# Patient Record
Sex: Female | Born: 1970 | Race: White | Hispanic: No | Marital: Married | Smoking: Never smoker
Health system: Southern US, Community
[De-identification: ages and names within clinical notes are randomized; demographics above are authoritative.]

## PROBLEM LIST (undated history)

## (undated) DIAGNOSIS — D219 Benign neoplasm of connective and other soft tissue, unspecified: Secondary | ICD-10-CM

## (undated) DIAGNOSIS — I10 Essential (primary) hypertension: Secondary | ICD-10-CM

## (undated) DIAGNOSIS — Z9289 Personal history of other medical treatment: Secondary | ICD-10-CM

## (undated) DIAGNOSIS — Z9889 Other specified postprocedural states: Secondary | ICD-10-CM

## (undated) DIAGNOSIS — N6001 Solitary cyst of right breast: Secondary | ICD-10-CM

## (undated) HISTORY — DX: Solitary cyst of right breast: N60.01

## (undated) HISTORY — DX: Personal history of other medical treatment: Z92.89

## (undated) HISTORY — DX: Benign neoplasm of connective and other soft tissue, unspecified: D21.9

## (undated) HISTORY — DX: Essential (primary) hypertension: I10

---

## 1999-03-14 HISTORY — PX: APPENDECTOMY: SHX54

## 2004-02-09 ENCOUNTER — Inpatient Hospital Stay: Payer: Self-pay

## 2005-04-07 ENCOUNTER — Other Ambulatory Visit: Payer: Self-pay

## 2005-04-07 ENCOUNTER — Ambulatory Visit: Payer: Self-pay | Admitting: Infectious Diseases

## 2005-12-26 ENCOUNTER — Ambulatory Visit: Payer: Self-pay

## 2006-11-28 ENCOUNTER — Ambulatory Visit: Payer: Self-pay | Admitting: Internal Medicine

## 2006-12-06 ENCOUNTER — Ambulatory Visit: Payer: Self-pay | Admitting: Internal Medicine

## 2007-08-14 ENCOUNTER — Ambulatory Visit: Payer: Self-pay | Admitting: Internal Medicine

## 2010-02-16 ENCOUNTER — Ambulatory Visit: Payer: Self-pay | Admitting: Obstetrics and Gynecology

## 2010-02-22 ENCOUNTER — Ambulatory Visit: Payer: Self-pay | Admitting: Obstetrics and Gynecology

## 2011-02-21 ENCOUNTER — Ambulatory Visit: Payer: Self-pay

## 2012-05-01 ENCOUNTER — Ambulatory Visit: Payer: Self-pay

## 2012-05-01 LAB — HCG, QUANTITATIVE, PREGNANCY: Beta Hcg, Quant.: <1 m[IU]/mL — ABNORMAL LOW

## 2012-05-09 ENCOUNTER — Ambulatory Visit: Payer: Self-pay

## 2012-05-09 HISTORY — PX: LAPAROSCOPIC SUPRACERVICAL HYSTERECTOMY: SUR797

## 2012-05-10 LAB — PATHOLOGY REPORT

## 2012-11-05 ENCOUNTER — Ambulatory Visit: Payer: Self-pay | Admitting: Internal Medicine

## 2014-03-18 ENCOUNTER — Ambulatory Visit: Payer: Self-pay

## 2014-07-03 NOTE — Op Note (Signed)
PATIENT NAME:  Kendra Bates, Kendra Bates MR#:  [id number] DATE OF BIRTH:  [birth year]  DATE OF PROCEDURE:  05/09/2012  PREOPERATIVE DIAGNOSES:  Leiomyoma and menorrhagia.   POSTOPERATIVE DIAGNOSES: Leiomyoma and menorrhagia.   PROCEDURE: Laparoscopic supracervical hysterectomy.   SURGEON: Wonda Cheng. Laurey Morale, MD  FIRST ASSISTANT: Prentice Docker, MD   OPERATIVE FINDINGS: A 14-week size leiomyomatous uterus. Tubes and ovaries appeared normal, therefore left in situ. The size of the uterus made exposure difficult. Leiomyoma was also calcified making morcellation difficult.   DESCRIPTION OF PROCEDURE: After adequate general anesthesia, the patient was prepped and draped in routine fashion. An infraumbilical incision was made through the skin and approximately 3 liters of carbon dioxide were insufflated without incident. During insufflation  uterine manipulator was placed and the bladder was drained. The laparoscope was then inserted. The above findings were noted. Accessory ports were placed in right and left lower quadrants. The left cornu was grasped and elevated. The round ligament, tube, suspensory ligament and ovary were serially divided with both Harmonic and bipolar forceps. A like procedure was then carried as out on the other side. The exposure of the uterine vessels was then attempted and the uterine vessels were cauterized with bipolar forceps bilaterally. A bladder flap had been created and the bladder was pushed down. The specimen was then amputated. Several additional areas of bleeding were cauterized. The endocervical canal was cauterized. The morcellator was inserted and, after much time, ultimately the uterus was totally morcellated. Morcellation was exceedingly difficult as part of the fibroids were calcified. Ultimately, the specimen was removed. The pelvis was lavaged with copious amounts of saline. All areas of surgery were inspected and found to be hemostatic. CO2 was allowed to escape and the  incisions closed in     routine fashion. The patient tolerated the procedure well and left the operating room in good condition. Sponge and needle counts were said to be correct at the end of the procedure. Estimated blood loss 100 mL.  ____________________________ Wonda Cheng. Laurey Morale, MD pjr:sb D: 05/09/2012 12:50:52 ET T: 05/09/2012 15:03:29 ET JOB#: [id number]  cc: Wonda Cheng. Laurey Morale, MD, <Dictator> Rosina Lowenstein MD ELECTRONICALLY SIGNED 05/16/2012 7:29

## 2015-06-11 DIAGNOSIS — Z9289 Personal history of other medical treatment: Secondary | ICD-10-CM

## 2015-06-11 HISTORY — DX: Personal history of other medical treatment: Z92.89

## 2015-11-09 ENCOUNTER — Other Ambulatory Visit: Payer: Self-pay | Admitting: Internal Medicine

## 2015-11-09 DIAGNOSIS — M542 Cervicalgia: Secondary | ICD-10-CM

## 2016-11-27 ENCOUNTER — Ambulatory Visit (INDEPENDENT_AMBULATORY_CARE_PROVIDER_SITE_OTHER): Payer: 59 | Admitting: Certified Nurse Midwife

## 2016-11-27 ENCOUNTER — Encounter: Payer: Self-pay | Admitting: Certified Nurse Midwife

## 2016-11-27 VITALS — BP 120/72 | HR 74 | Ht 64.5 in | Wt 144.0 lb

## 2016-11-27 DIAGNOSIS — Z1239 Encounter for other screening for malignant neoplasm of breast: Secondary | ICD-10-CM

## 2016-11-27 DIAGNOSIS — I10 Essential (primary) hypertension: Secondary | ICD-10-CM | POA: Insufficient documentation

## 2016-11-27 DIAGNOSIS — D219 Benign neoplasm of connective and other soft tissue, unspecified: Secondary | ICD-10-CM | POA: Insufficient documentation

## 2016-11-27 DIAGNOSIS — Z1231 Encounter for screening mammogram for malignant neoplasm of breast: Secondary | ICD-10-CM | POA: Diagnosis not present

## 2016-11-27 DIAGNOSIS — Z1211 Encounter for screening for malignant neoplasm of colon: Secondary | ICD-10-CM | POA: Diagnosis not present

## 2016-11-27 DIAGNOSIS — Z124 Encounter for screening for malignant neoplasm of cervix: Secondary | ICD-10-CM

## 2016-11-27 DIAGNOSIS — Z01419 Encounter for gynecological examination (general) (routine) without abnormal findings: Secondary | ICD-10-CM | POA: Diagnosis not present

## 2016-11-27 LAB — HEMOCCULT GUIAC POC 1CARD (OFFICE): FECAL OCCULT BLD: NEGATIVE

## 2016-11-27 NOTE — Progress Notes (Signed)
Gynecology Annual Exam  PCP: Idelle Crouch, MD  Chief Complaint:  Chief Complaint  Patient presents with  . Gynecologic Exam    History of Present Illness:Kendra Bates is a 46 year old Caucasian/White female , G 1 P 1 0 0 1 , who presents for her annual exam . She is having no significant GYN problems. Will occasionally have some irritation at her vaginal opening.  Her menses are absent s/p [facility] for fibroids She does not have vasomotor symptoms.  She has had no spotting.   The patient's past medical history is notable for a history of hypertension and anxiety/stress..  Since her last annual GYN exam dated 06/11/2015, she has had no significant changes in her health history.  She is sexually active.  Her most recent pap smear was obtained 06/11/2015 and was with negative cells and negative HPV DNA. Remote history of abnormal 2005/2006. Had colposcopy. Paps normal since then. Her most recent mammogram obtained on 06/11/2015 was read as negative with stable calcifications There is a positive history of breast cancer in her mother. Genetic testing has not been done.  There is no family history of ovarian cancer.  The patient does do occ monthly self breast exams.  The patient does not smoke.  The patient does drink alcohol occasionally  The patient does not use illegal drugs.  The patient exercises occasionally. Has just joined a gym. The patient does get adequate calcium in her diet.  She had a recent cholesterol screen in 2018 ( by her PCP Dr Doy Hutching) that was normal.  .  The patient denies current symptoms of depression.    Review of Systems: Review of Systems  Constitutional: Negative for chills, fever and weight loss.  HENT: Negative for congestion, sinus pain and sore throat.   Eyes: Negative for blurred vision and pain.  Respiratory: Negative for hemoptysis, shortness of breath and wheezing.   Cardiovascular: Negative for chest pain, palpitations and leg swelling.    Gastrointestinal: Negative for abdominal pain, blood in stool, diarrhea, heartburn, nausea and vomiting.  Genitourinary: Negative for dysuria, frequency, hematuria and urgency.  Musculoskeletal: Negative for back pain, joint pain and myalgias.  Skin: Negative for itching and rash.  Neurological: Negative for dizziness, tingling and headaches.  Endo/Heme/Allergies: Negative for environmental allergies and polydipsia. Does not bruise/bleed easily.       Negative for hirsutism   Psychiatric/Behavioral: Negative for depression. The patient is not nervous/anxious and does not have insomnia.     Past Medical History:  Past Medical History:  Diagnosis Date  . Fibroids    leiomyoma  . History of mammogram 06/11/2015   benign  . Hypertension     Past Surgical History:  Past Surgical History:  Procedure Laterality Date  . APPENDECTOMY  2001  . LAPAROSCOPIC SUPRACERVICAL HYSTERECTOMY  05/09/2012   leiomyoma/mennorrhagia    Family History:  Family History  Problem Relation Age of Onset  . Breast cancer Mother 68  . Hypertension Mother   . Congestive Heart Failure Maternal Grandmother 86  . Breast cancer Cousin        paternal first    Social History:  Social History   Social History  . Marital status: Married    Spouse name: N/A  . Number of children: 1  . Years of education: 47   Occupational History  . Receptionist at [employer]    Social History Main Topics  . Smoking status: Never Smoker  . Smokeless tobacco: Never  Used  . Alcohol use Yes     Comment: occasionally  . Drug use: No  . Sexual activity: Yes    Birth control/ protection: Surgical   Other Topics Concern  . Not on file   Social History Narrative  . No narrative on file    Allergies:  Allergies  Allergen Reactions  . Sporanox [Itraconazole] Rash  . Augmentin [Amoxicillin-Pot Clavulanate] Rash    Medications: Prior to Admission medications   Medication Sig Start Date End Date Taking?  Authorizing Provider  amLODipine (NORVASC) 5 MG tablet Take by mouth. 11/01/16  Yes [provider]  escitalopram (LEXAPRO) 10 MG tablet Take by mouth. 11/01/16  Yes [provider]  losartan (COZAAR) 100 MG tablet Take by mouth. 11/01/16  Yes [provider]  Vitamin D3 1000 IU daily  Physical Exam Vitals: Blood pressure 120/72, pulse 74, height 5' 4.5" (1.638 m), weight 144 lb (65.3 kg). BMI 24.34 kg/m2  General: WF inq NAD HEENT: normocephalic, anicteric Neck: no thyroid enlargement, no palpable nodules; shoddy NT cervical lymphadenopathy in anterior chain  Pulmonary: No increased work of breathing, CTAB Cardiovascular: RRR, without murmur  Breast: Breast symmetrical, no tenderness, no skin retraction present, no nipple discharge. Left nipple chronically "indented". There is a 1 cm sebaccous cyst in right breast at 6 o'clock just below the aerola. No breast masses bilaterally No axillary, infraclavicular or supraclavicular lymphadenopathy. Abdomen: Soft, non-tender, non-distended.  Umbilicus without lesions.  No hepatomegaly or masses palpable. No evidence of hernia. Genitourinary:  External: Normal external female genitalia.  Normal urethral meatus, normal Bartholin's and Skene's glands.    Vagina: Normal vaginal mucosa, no evidence of prolapse.    Cervix: Grossly normal in appearance, no bleeding, non-tender  Uterus: surgically absent  Adnexa: No adnexal masses, non-tender  Rectal: NO MASSES, HEMOCCULT NEGATIVE  Lymphatic: no evidence of inguinal lymphadenopathy Extremities: no edema, erythema, or tenderness Neurologic: Grossly intact Psychiatric: mood appropriate, affect full     Assessment: 46 y.o. G1P1001 normal annual gyn exam  Plan:   1) Breast cancer screening - recommend monthly self breast exam. Mammogram was ordered today. Patient to schedule at [facility]  2) Colon cancer screening options discussed with patient. Desires FIT test in office  annually at this time. FIT was negative.  3) Cervical cancer screening - Pap was done. ASCCP guidelines and rational discussed.  Patient opts for yearly screening interval  4) Contraception -NA due to hystertectomy  5) Routine healthcare maintenance including cholesterol and diabetes screening managed by PCP   Dalia Heading, CNM

## 2016-11-29 LAB — IGP,RFX APTIMA HPV ALL PTH: PAP Smear Comment: 0

## 2016-12-08 ENCOUNTER — Other Ambulatory Visit: Payer: Self-pay | Admitting: *Deleted

## 2016-12-08 ENCOUNTER — Inpatient Hospital Stay
Admission: RE | Admit: 2016-12-08 | Discharge: 2016-12-08 | Disposition: A | Discharge status: Home / Self Care | Payer: Self-pay | Source: Ambulatory Visit | Attending: *Deleted | Admitting: *Deleted

## 2016-12-08 DIAGNOSIS — Z9289 Personal history of other medical treatment: Secondary | ICD-10-CM

## 2016-12-11 DIAGNOSIS — N6001 Solitary cyst of right breast: Secondary | ICD-10-CM

## 2016-12-11 HISTORY — DX: Solitary cyst of right breast: N60.01

## 2016-12-21 ENCOUNTER — Ambulatory Visit
Admission: RE | Admit: 2016-12-21 | Discharge: 2016-12-21 | Disposition: A | Discharge status: Home / Self Care | Payer: 59 | Source: Ambulatory Visit | Attending: Certified Nurse Midwife | Admitting: Certified Nurse Midwife

## 2016-12-21 ENCOUNTER — Other Ambulatory Visit: Payer: Self-pay | Admitting: Certified Nurse Midwife

## 2016-12-21 DIAGNOSIS — R928 Other abnormal and inconclusive findings on diagnostic imaging of breast: Secondary | ICD-10-CM

## 2016-12-21 DIAGNOSIS — Z1231 Encounter for screening mammogram for malignant neoplasm of breast: Secondary | ICD-10-CM | POA: Diagnosis not present

## 2016-12-21 DIAGNOSIS — Z1239 Encounter for other screening for malignant neoplasm of breast: Secondary | ICD-10-CM

## 2016-12-21 DIAGNOSIS — N6310 Unspecified lump in the right breast, unspecified quadrant: Secondary | ICD-10-CM

## 2016-12-29 ENCOUNTER — Ambulatory Visit
Admission: RE | Admit: 2016-12-29 | Discharge: 2016-12-29 | Disposition: A | Discharge status: Home / Self Care | Payer: 59 | Source: Ambulatory Visit | Attending: Certified Nurse Midwife | Admitting: Certified Nurse Midwife

## 2016-12-29 DIAGNOSIS — R928 Other abnormal and inconclusive findings on diagnostic imaging of breast: Secondary | ICD-10-CM

## 2016-12-29 DIAGNOSIS — L723 Sebaceous cyst: Secondary | ICD-10-CM | POA: Insufficient documentation

## 2016-12-29 DIAGNOSIS — N6310 Unspecified lump in the right breast, unspecified quadrant: Secondary | ICD-10-CM

## 2017-07-09 ENCOUNTER — Other Ambulatory Visit: Payer: Self-pay

## 2017-07-09 ENCOUNTER — Encounter: Payer: Self-pay | Admitting: Emergency Medicine

## 2017-07-09 ENCOUNTER — Emergency Department
Admission: EM | Admit: 2017-07-09 | Discharge: 2017-07-09 | Disposition: A | Discharge status: Home / Self Care | Payer: 59 | Attending: Emergency Medicine | Admitting: Emergency Medicine

## 2017-07-09 DIAGNOSIS — R112 Nausea with vomiting, unspecified: Secondary | ICD-10-CM | POA: Diagnosis present

## 2017-07-09 DIAGNOSIS — I10 Essential (primary) hypertension: Secondary | ICD-10-CM | POA: Insufficient documentation

## 2017-07-09 DIAGNOSIS — K529 Noninfective gastroenteritis and colitis, unspecified: Secondary | ICD-10-CM

## 2017-07-09 DIAGNOSIS — Z79899 Other long term (current) drug therapy: Secondary | ICD-10-CM | POA: Diagnosis not present

## 2017-07-09 LAB — COMPREHENSIVE METABOLIC PANEL
ALT: 17 U/L (ref 14–54)
ANION GAP: 11 (ref 5–15)
AST: 23 U/L (ref 15–41)
Albumin: 4.6 g/dL (ref 3.5–5.0)
Alkaline Phosphatase: 91 U/L (ref 38–126)
BUN: 20 mg/dL (ref 6–20)
CHLORIDE: 102 mmol/L (ref 101–111)
CO2: 23 mmol/L (ref 22–32)
CREATININE: 0.65 mg/dL (ref 0.44–1.00)
Calcium: 9.2 mg/dL (ref 8.9–10.3)
Glucose, Bld: 111 mg/dL — ABNORMAL HIGH (ref 65–99)
POTASSIUM: 4.4 mmol/L (ref 3.5–5.1)
SODIUM: 136 mmol/L (ref 135–145)
Total Bilirubin: 1.2 mg/dL (ref 0.3–1.2)
Total Protein: 7.8 g/dL (ref 6.5–8.1)

## 2017-07-09 LAB — CBC
HEMATOCRIT: 43 % (ref 35.0–47.0)
HEMOGLOBIN: 14.8 g/dL (ref 12.0–16.0)
MCH: 32 pg (ref 26.0–34.0)
MCHC: 34.5 g/dL (ref 32.0–36.0)
MCV: 92.8 fL (ref 80.0–100.0)
PLATELETS: 158 10*3/uL (ref 150–440)
RBC: 4.64 MIL/uL (ref 3.80–5.20)
RDW: 12.7 % (ref 11.5–14.5)
WBC: 10.9 10*3/uL (ref 3.6–11.0)

## 2017-07-09 LAB — LIPASE, BLOOD: LIPASE: 27 U/L (ref 11–51)

## 2017-07-09 MED ORDER — DICYCLOMINE HCL 20 MG PO TABS: 20.0000 mg | ORAL_TABLET | Freq: Three times a day (TID) | ORAL | 0 refills | Status: AC | PRN

## 2017-07-09 MED ORDER — ONDANSETRON HCL 4 MG/2ML IJ SOLN
INTRAMUSCULAR | Status: AC
  Filled 2017-07-09: qty 2

## 2017-07-09 MED ORDER — ONDANSETRON HCL 4 MG/2ML IJ SOLN
4.0000 mg | Freq: Once | INTRAMUSCULAR | Status: AC
  Administered 2017-07-09: 4 mg via INTRAVENOUS

## 2017-07-09 MED ORDER — ONDANSETRON HCL 4 MG PO TABS: 4.0000 mg | ORAL_TABLET | Freq: Three times a day (TID) | ORAL | 0 refills | Status: AC | PRN

## 2017-07-09 MED ORDER — SODIUM CHLORIDE 0.9 % IV BOLUS
1000.0000 mL | Freq: Once | INTRAVENOUS | Status: AC
  Administered 2017-07-09: 1000 mL via INTRAVENOUS

## 2017-07-09 NOTE — ED Notes (Signed)
Report received from [facility]. Patient care assumed. Patient/RN introduction complete. Will continue to monitor.

## 2017-07-09 NOTE — ED Notes (Signed)
Patient discharged to home per MD order. Patient in stable condition, and deemed medically cleared by ED provider for discharge. Discharge instructions reviewed with patient/family using "Teach Back"; verbalized understanding of medication education and administration, and information about follow-up care. Denies further concerns. ° °

## 2017-07-09 NOTE — Discharge Instructions (Addendum)
Please seek medical attention for any high fevers, chest pain, shortness of breath, change in behavior, persistent vomiting, bloody stool or any other new or concerning symptoms.  

## 2017-07-09 NOTE — ED Notes (Signed)
Pt states pain improved rates it 3/10 at this time, given ginger ale.

## 2017-07-09 NOTE — ED Provider Notes (Signed)
[facility] Emergency Department Provider Note  ____________________________________________   I have reviewed the triage vital signs and the nursing notes.   HISTORY  Chief Complaint Abdominal Pain   History limited by: Not Limited   HPI Kendra Bates is a 47 y.o. female who presents to the emergency department today because of concerns for nausea vomiting and diarrhea.  The symptoms started suddenly.  This started 1 PM today.  Has been accompanied by some centralized abdominal pain and discomfort.  Patient denies any unusual ingestions.  She might of had a sick contact at work although they worked in a slightly different division.  Patient has not had any fevers.   Per medical record review patient has a history of appendectomy.  Past Medical History:  Diagnosis Date  . Fibroids    leiomyoma  . History of mammogram 06/11/2015   benign  . Hypertension     Patient Active Problem List   Diagnosis Date Noted  . Hypertension   . Fibroids     Past Surgical History:  Procedure Laterality Date  . APPENDECTOMY  2001  . LAPAROSCOPIC SUPRACERVICAL HYSTERECTOMY  05/09/2012   leiomyoma/mennorrhagia    Prior to Admission medications   Medication Sig Start Date End Date Taking? Authorizing Provider  amLODipine (NORVASC) 5 MG tablet Take by mouth. 11/01/16   [provider]  escitalopram (LEXAPRO) 10 MG tablet Take by mouth. 11/01/16   [provider]  losartan (COZAAR) 100 MG tablet Take by mouth. 11/01/16   [provider]  Vitamin D, Cholecalciferol, 1000 units TABS Take 1 tablet by mouth daily.    [provider]    Allergies Sporanox [itraconazole] and Augmentin [amoxicillin-pot clavulanate]  Family History  Problem Relation Age of Onset  . Breast cancer Mother 67  . Hypertension Mother   . Congestive Heart Failure Maternal Grandmother 86  . Breast cancer Cousin        paternal first    Social  History Social History   Tobacco Use  . Smoking status: Never Smoker  . Smokeless tobacco: Never Used  Substance Use Topics  . Alcohol use: Yes    Comment: occasionally  . Drug use: No    Review of Systems Constitutional: No fever/chills Eyes: No visual changes. ENT: No sore throat. Cardiovascular: Denies chest pain. Respiratory: Denies shortness of breath. Gastrointestinal: Positive for centralized abdominal pain. Nausea, vomiting and diarrhea. Genitourinary: Negative for dysuria. Musculoskeletal: Positive for back pain.  Skin: Negative for rash. Neurological: Negative for headaches, focal weakness or numbness.  ____________________________________________   PHYSICAL EXAM:  VITAL SIGNS: ED Triage Vitals  Enc Vitals Group     BP 07/09/17 1645 (!) 120/57     Pulse Rate 07/09/17 1645 96     Resp 07/09/17 1645 18     Temp 07/09/17 1644 99.8 F (37.7 C)     Temp Source 07/09/17 1644 Oral     SpO2 07/09/17 1645 100 %     Weight 07/09/17 1644 140 lb (63.5 kg)     Height 07/09/17 1644 5\' 5"  (1.651 m)     Head Circumference --      Peak Flow --      Pain Score 07/09/17 1644 7   Constitutional: Alert and oriented. Well appearing and in no distress. Eyes: Conjunctivae are normal.  ENT   Head: Normocephalic and atraumatic.   Nose: No congestion/rhinnorhea.   Mouth/Throat: Mucous membranes are moist.   Neck: No stridor. Hematological/Lymphatic/Immunilogical: No cervical lymphadenopathy. Cardiovascular:  Normal rate, regular rhythm.  No murmurs, rubs, or gallops. Respiratory: Normal respiratory effort without tachypnea nor retractions. Breath sounds are clear and equal bilaterally. No wheezes/rales/rhonchi. Gastrointestinal: Soft and minimally tender diffusely. No rebound. No guarding.  Genitourinary: Deferred Musculoskeletal: Normal range of motion in all extremities. No lower extremity edema. Neurologic:  Normal speech and language. No gross focal neurologic  deficits are appreciated.  Skin:  Skin is warm, dry and intact. No rash noted. Psychiatric: Mood and affect are normal. Speech and behavior are normal. Patient exhibits appropriate insight and judgment.  ____________________________________________    LABS (pertinent positives/negatives)  Lipase 27 CMP wnl except glu 111 CBC wbc 10.9, hgb 14.8, plt 158  ____________________________________________   EKG  None  ____________________________________________    RADIOLOGY  None  ____________________________________________   PROCEDURES  Procedures  ____________________________________________   INITIAL IMPRESSION / ASSESSMENT AND PLAN / ED COURSE  Pertinent labs & imaging results that were available during my care of the patient were reviewed by me and considered in my medical decision making (see chart for details).  Patient presented to the emergency department today because of concerns for nausea vomiting diarrhea and some abdominal discomfort.  Differential would be broad including hepatitis pancreatitis gastritis gastroenteritis appendicitis diverticulitis amongst other etiologies.  Patient's blood work without any concerning findings.  She did feel better after fluids and antiemetics.  At this point I think gastroenteritis likely especially given sick contact possibility.  Did discuss this with the patient.  Discussed return precautions.   ____________________________________________   FINAL CLINICAL IMPRESSION(S) / ED DIAGNOSES  Final diagnoses:  Gastroenteritis     Note: This dictation was prepared with Dragon dictation. Any transcriptional errors that result from this process are unintentional     Nance Pear, MD 07/09/17 2028

## 2017-07-09 NOTE — ED Triage Notes (Signed)
Here for NVD since 1 pm today.  Unable to keep anything down per pt.  Some abdominal soreness that started after the vomiting/dry heaves.  VSS.  Color WNL. NAD

## 2017-07-09 NOTE — ED Notes (Signed)
Pt tolerating po fluids well  

## 2017-07-25 ENCOUNTER — Encounter: Payer: Self-pay | Admitting: Obstetrics and Gynecology

## 2017-07-25 ENCOUNTER — Ambulatory Visit (INDEPENDENT_AMBULATORY_CARE_PROVIDER_SITE_OTHER): Payer: 59 | Admitting: Obstetrics and Gynecology

## 2017-07-25 VITALS — BP 110/74 | HR 71 | Ht 65.0 in | Wt 144.0 lb

## 2017-07-25 DIAGNOSIS — L089 Local infection of the skin and subcutaneous tissue, unspecified: Secondary | ICD-10-CM

## 2017-07-25 DIAGNOSIS — L723 Sebaceous cyst: Secondary | ICD-10-CM

## 2017-07-25 MED ORDER — DOXYCYCLINE HYCLATE 100 MG PO CAPS: 100.0000 mg | ORAL_CAPSULE | Freq: Two times a day (BID) | ORAL | 0 refills | Status: AC

## 2017-07-25 NOTE — Progress Notes (Signed)
Kendra Crouch, MD   Chief Complaint  Patient presents with  . Breast Problem    Cyst in right breast is sore, red, and swollen x4    HPI:      Kendra Bates is a 47 y.o. G1P1001 who LMP was No LMP recorded. Patient has had a hysterectomy., presents today for infected RT breast cyst for the past 4 days. Sx started randomly. No d/c, fevers. Did do warm compress last night and neosporin today. Pt was noted to have "1.2 cm benign palpable sebaceous cyst associated with the skin of the 6 o'clock right breast 1 cm from the nipple" on 10/18 mammo. Pt hasn't had any problems until now.  No other breast mass/complaints. FH of breast cancer in her mom and pat cousin, doesn't qualify for cancer genetic testing.   Past Medical History:  Diagnosis Date  . Benign cyst of right breast in female 12/2016  . Fibroids    leiomyoma  . History of mammogram 06/11/2015   benign  . Hypertension     Past Surgical History:  Procedure Laterality Date  . APPENDECTOMY  2001  . LAPAROSCOPIC SUPRACERVICAL HYSTERECTOMY  05/09/2012   leiomyoma/mennorrhagia    Family History  Problem Relation Age of Onset  . Breast cancer Mother 31  . Hypertension Mother   . Congestive Heart Failure Maternal Grandmother 86  . Breast cancer Cousin        paternal first    Social History   Socioeconomic History  . Marital status: Married    Spouse name: Not on file  . Number of children: 1  . Years of education: 55  . Highest education level: Not on file  Occupational History  . Occupation: Receptionist at [employer]  . Financial resource strain: Not on file  . Food insecurity:    Worry: Not on file    Inability: Not on file  . Transportation needs:    Medical: Not on file    Non-medical: Not on file  Tobacco Use  . Smoking status: Never Smoker  . Smokeless tobacco: Never Used  Substance and Sexual Activity  . Alcohol use: Yes    Comment: occasionally  . Drug use: No  .  Sexual activity: Yes    Birth control/protection: Surgical  Lifestyle  . Physical activity:    Days per week: Not on file    Minutes per session: Not on file  . Stress: Not on file  Relationships  . Social connections:    Talks on phone: Not on file    Gets together: Not on file    Attends religious service: Not on file    Active member of club or organization: Not on file    Attends meetings of clubs or organizations: Not on file    Relationship status: Not on file  . Intimate partner violence:    Fear of current or ex partner: Not on file    Emotionally abused: Not on file    Physically abused: Not on file    Forced sexual activity: Not on file  Other Topics Concern  . Not on file  Social History Narrative  . Not on file    Outpatient Medications Prior to Visit  Medication Sig Dispense Refill  . amLODipine (NORVASC) 5 MG tablet Take by mouth.    . escitalopram (LEXAPRO) 10 MG tablet Take by mouth.    . losartan (COZAAR) 100 MG tablet Take by mouth.    Marland Kitchen  Vitamin D, Cholecalciferol, 1000 units TABS Take 1 tablet by mouth daily.    Marland Kitchen dicyclomine (BENTYL) 20 MG tablet Take 1 tablet (20 mg total) by mouth 3 (three) times daily as needed (abdominal pain). (Patient not taking: Reported on 07/25/2017) 30 tablet 0  . ondansetron (ZOFRAN) 4 MG tablet Take 1 tablet (4 mg total) by mouth every 8 (eight) hours as needed for nausea or vomiting. (Patient not taking: Reported on 07/25/2017) 20 tablet 0   No facility-administered medications prior to visit.       ROS:  Review of Systems  Constitutional: Negative for fever.  Genitourinary: Negative for dyspareunia, dysuria, flank pain, frequency, hematuria, urgency, vaginal bleeding, vaginal discharge and vaginal pain.  Musculoskeletal: Negative for back pain.  Skin: Positive for rash and wound.  BREAST: pain, infection, redness   OBJECTIVE:   Vitals:  BP 110/74   Pulse 71   Ht 5\' 5"  (1.651 m)   Wt 144 lb (65.3 kg)   BMI 23.96  kg/m   Physical Exam  Constitutional: She is oriented to person, place, and time. She appears well-developed.  Neck: Normal range of motion.  Pulmonary/Chest: Effort normal. Right breast exhibits mass, skin change and tenderness. Right breast exhibits no inverted nipple and no nipple discharge. Left breast exhibits no inverted nipple, no mass, no nipple discharge, no skin change and no tenderness. There is breast discharge. Breasts are symmetrical.  RT BREAST 7:00 (AT AREA OF CYST PER PT REPORT) WITH ERYTHEMA, ~2.5 X 2 CM FIRM, TENDER MASS; SMALL D/C AT BORDER OF LESION; POS RUBOR    Abdominal: Soft.  Musculoskeletal: Normal range of motion.  Lymphadenopathy:    She has no axillary adenopathy.  Neurological: She is alert and oriented to person, place, and time.  Psychiatric: She has a normal mood and affect. Her behavior is normal. Judgment and thought content normal.  Vitals reviewed.  Assessment/Plan: Infected sebaceous cyst - Rx doxy/warm compresses. Refer to gen surg for further eval/mgmt. Discussed exc once infection resolves. F/u sooner prn. - Plan: doxycycline (VIBRAMYCIN) 100 MG capsule, Ambulatory referral to General Surgery    Meds ordered this encounter  Medications  . doxycycline (VIBRAMYCIN) 100 MG capsule    Sig: Take 1 capsule (100 mg total) by mouth 2 (two) times daily for 10 days.    Dispense:  20 capsule    Refill:  0    Order Specific Question:   Supervising Provider    Answer:   Gae Dry [[id number]]      Return if symptoms worsen or fail to improve.  Alicia B. Copland, PA-C 07/25/2017 4:33 PM

## 2017-07-25 NOTE — Patient Instructions (Signed)
I value your feedback and entrusting us with your care. If you get a [facility] patient survey, I would appreciate you taking the time to let us know about your experience today. Thank you! 

## 2017-12-26 ENCOUNTER — Other Ambulatory Visit: Payer: Self-pay | Admitting: Internal Medicine

## 2017-12-26 DIAGNOSIS — Z1231 Encounter for screening mammogram for malignant neoplasm of breast: Secondary | ICD-10-CM

## 2018-01-15 ENCOUNTER — Ambulatory Visit
Admission: RE | Admit: 2018-01-15 | Discharge: 2018-01-15 | Disposition: A | Discharge status: Home / Self Care | Payer: 59 | Source: Ambulatory Visit | Attending: Internal Medicine | Admitting: Internal Medicine

## 2018-01-15 DIAGNOSIS — Z1231 Encounter for screening mammogram for malignant neoplasm of breast: Secondary | ICD-10-CM

## 2018-09-17 IMAGING — MG 2D DIGITAL DIAGNOSTIC UNILATERAL RIGHT MAMMOGRAM WITH CAD AND AD
8 series · 8 of 16 positions shown · non-contrast
Comparison: 12/21/2016 and earlier priors

CLINICAL DATA: 46-year-old patient recalled from recent screening
mammogram for evaluation of a possible mass in the retroareolar
right breast.

EXAM:
2D DIGITAL DIAGNOSTIC RIGHT MAMMOGRAM WITH CAD AND ADJUNCT TOMO
ULTRASOUND RIGHT BREAST

[R CC (1 of 2)]
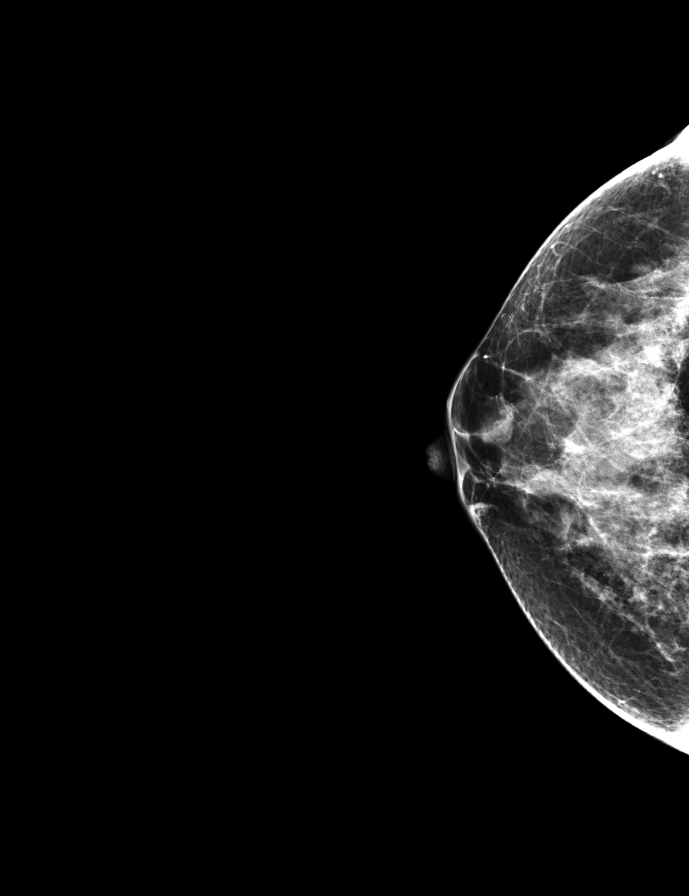

[R MLO (1 of 2)]
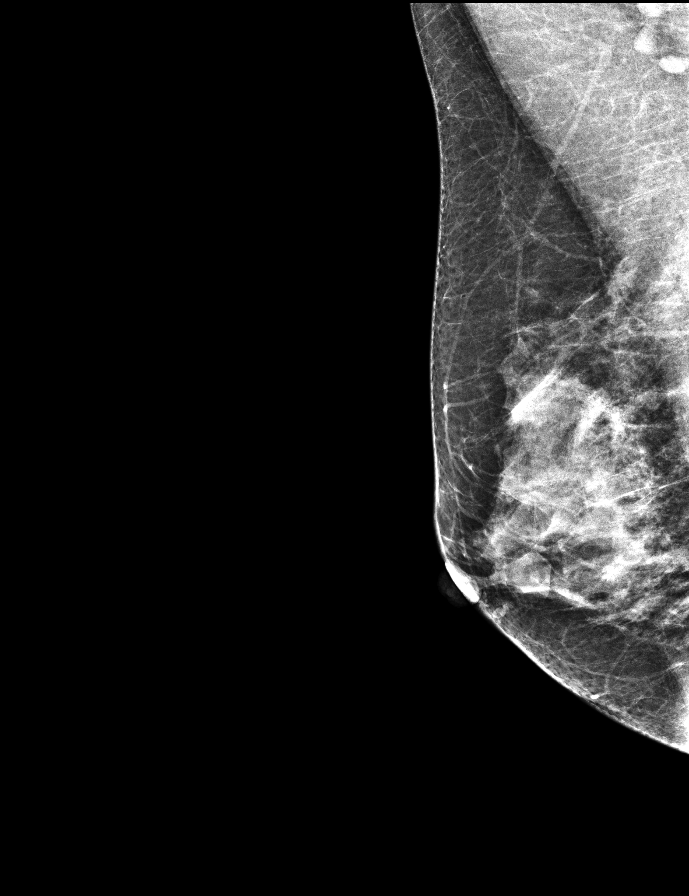

[R CC synth-2D]
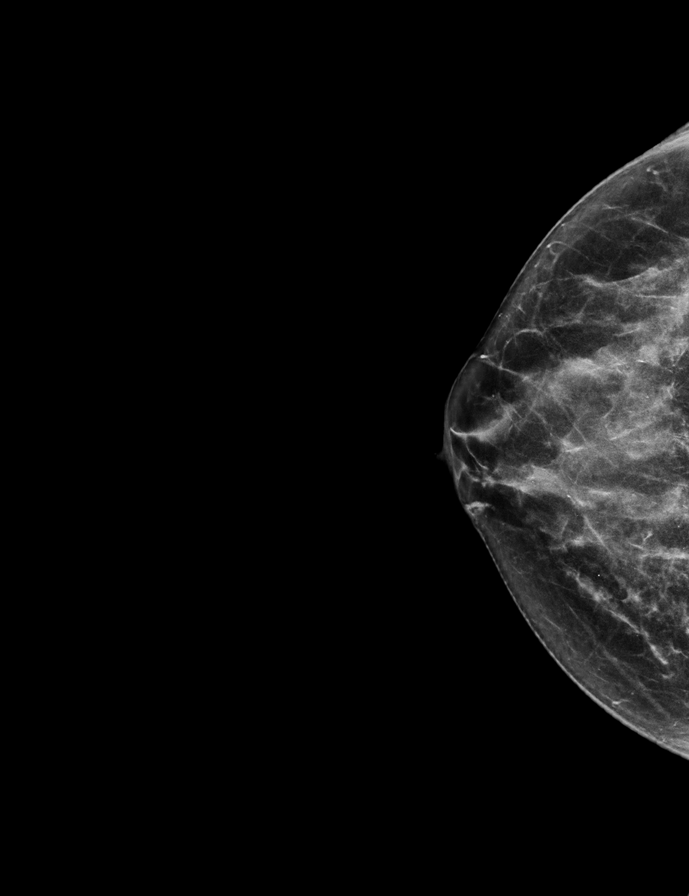

[R MLO synth-2D]
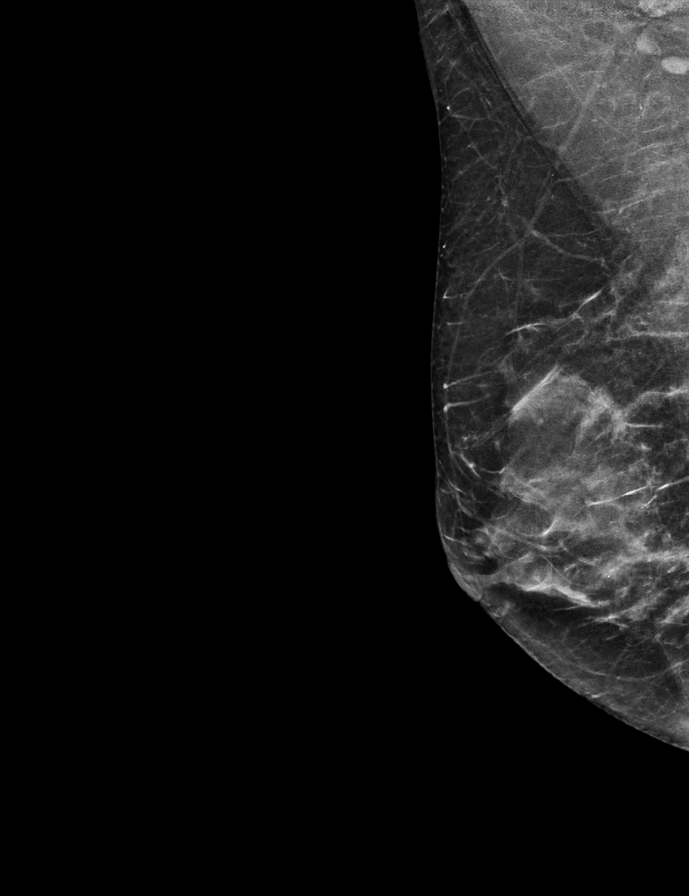

[R MLO tomo · tomo slice 29/57.0]
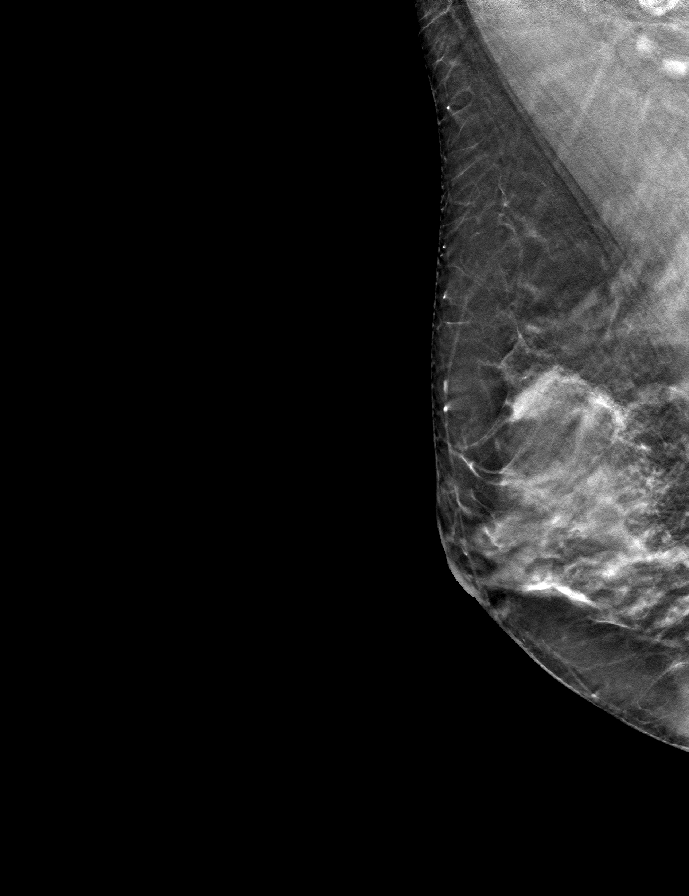

[R CC tomo · tomo slice 29/58.0]
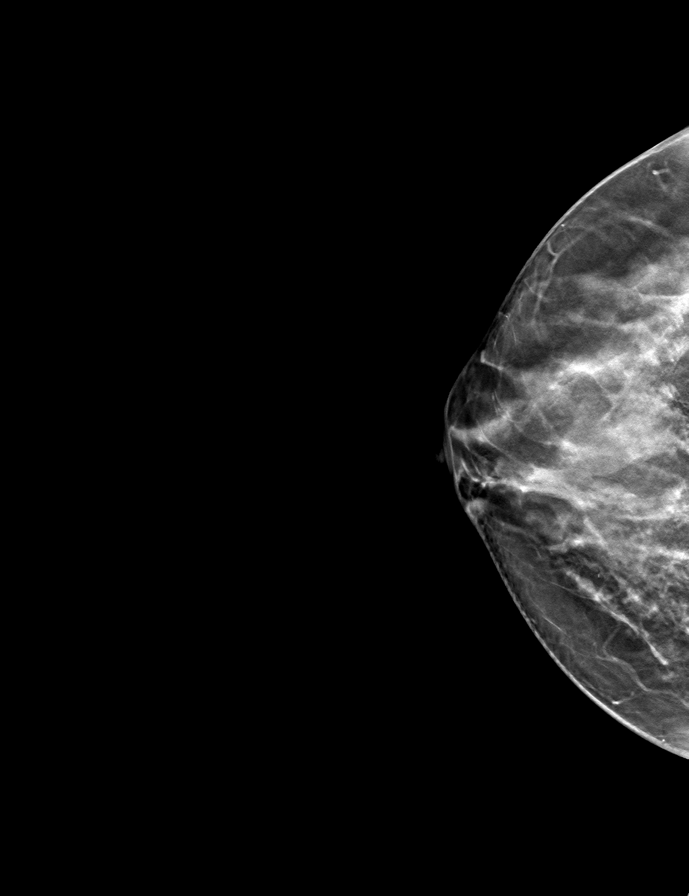

[R CC (2 of 2)]
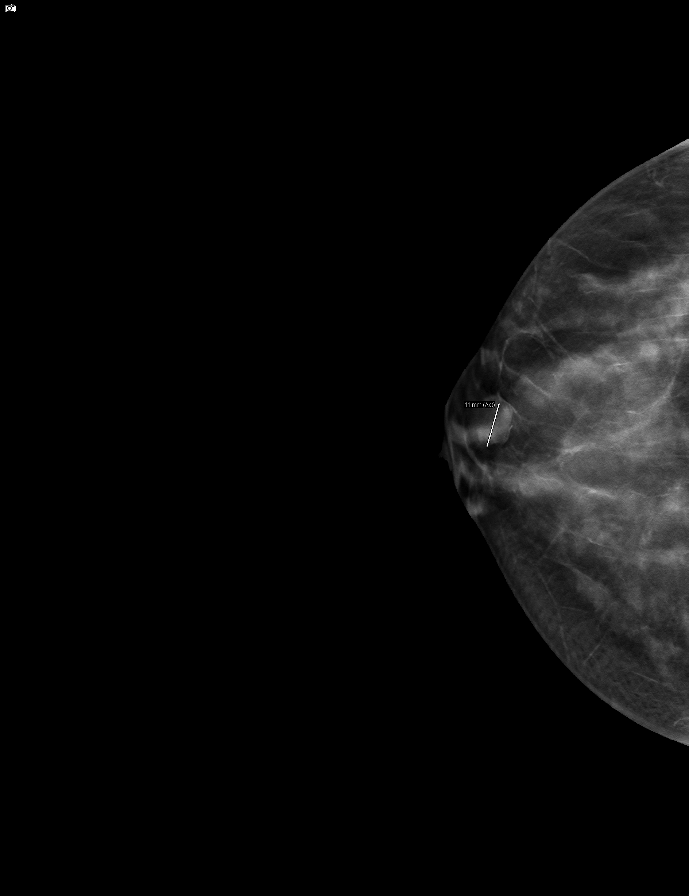

[R MLO (2 of 2)]
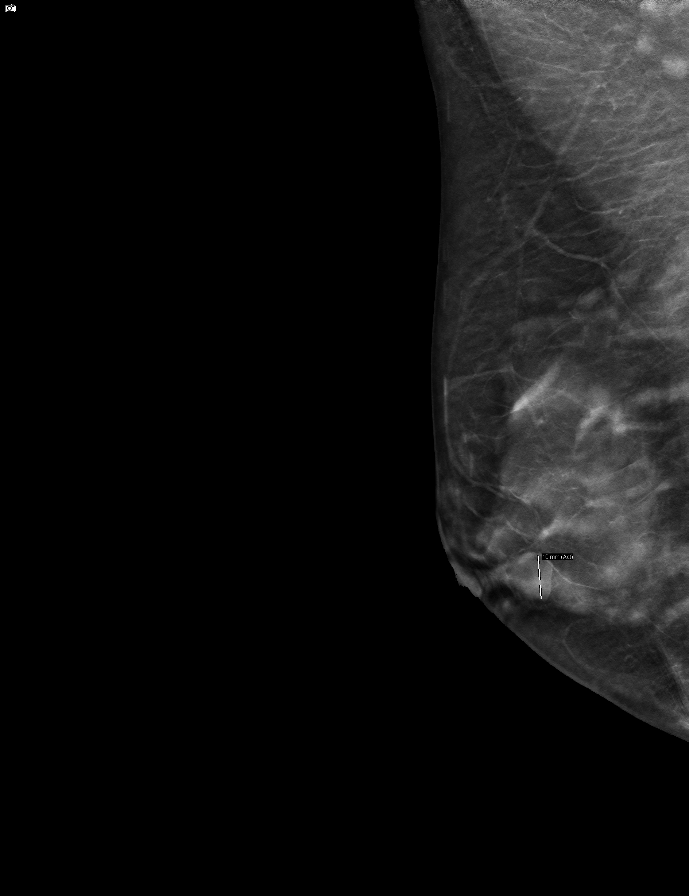

[8 of 16 positions shown; findings below may reference images not displayed]

ACR Breast Density Category c: The breast tissue is heterogeneously
dense, which may obscure small masses.
FINDINGS: Whole breast CC and MLO views with tomography are performed. There
is a superficially positioned oval mass in the inferior periareolar
right breast.

Mammographic images were processed with CAD.

On physical exam, there is a superficial palpable smooth lump
associated with the dermis of the right breast 6 o'clock position 1
cm from the nipple, periareolar. A visible dark pore in the skin is
seen over the center of the palpable mass, with clinical findings
consistent with a palpable sebaceous cyst.

Targeted ultrasound is performed, showing an oval circumscribed
cystic mass with internal echogenic debris. The mass is centered in
the dermis, with a claw sign seen on sonography indicating that the
mass is originating from the dermis. The mass measures 1.2 x 1.0 x
0.7 cm. There is no internal vascular flow. The subtending breast
parenchyma is normal.
IMPRESSION: 1.2 cm benign palpable sebaceous cyst associated with the skin of
the 6 o'clock right breast 1 cm from the nipple. No evidence of
malignancy.

RECOMMENDATION:
Screening mammogram in one year.(Code:[id number]-U-67L)

I have discussed the findings and recommendations with the patient.
Results were also provided in writing at the conclusion of the
visit. If applicable, a reminder letter will be sent to the patient
regarding the next appointment.

BI-RADS CATEGORY  2: Benign.

## 2019-01-14 ENCOUNTER — Other Ambulatory Visit: Payer: Self-pay | Admitting: Internal Medicine

## 2019-01-14 DIAGNOSIS — Z1231 Encounter for screening mammogram for malignant neoplasm of breast: Secondary | ICD-10-CM

## 2019-01-22 ENCOUNTER — Ambulatory Visit
Admission: RE | Admit: 2019-01-22 | Discharge: 2019-01-22 | Disposition: A | Discharge status: Home / Self Care | Payer: Managed Care, Other (non HMO) | Source: Ambulatory Visit | Attending: Internal Medicine | Admitting: Internal Medicine

## 2019-01-22 DIAGNOSIS — Z1231 Encounter for screening mammogram for malignant neoplasm of breast: Secondary | ICD-10-CM | POA: Insufficient documentation

## 2020-03-30 ENCOUNTER — Other Ambulatory Visit: Payer: Self-pay | Admitting: Internal Medicine

## 2020-03-30 DIAGNOSIS — Z1231 Encounter for screening mammogram for malignant neoplasm of breast: Secondary | ICD-10-CM

## 2020-04-06 ENCOUNTER — Other Ambulatory Visit: Payer: Self-pay

## 2020-04-06 ENCOUNTER — Ambulatory Visit
Admission: RE | Admit: 2020-04-06 | Discharge: 2020-04-06 | Disposition: A | Discharge status: Home / Self Care | Payer: Managed Care, Other (non HMO) | Source: Ambulatory Visit | Attending: Internal Medicine | Admitting: Internal Medicine

## 2020-04-06 DIAGNOSIS — Z1231 Encounter for screening mammogram for malignant neoplasm of breast: Secondary | ICD-10-CM

## 2021-02-12 LAB — EXTERNAL GENERIC LAB PROCEDURE: COLOGUARD: NEGATIVE

## 2021-04-15 ENCOUNTER — Other Ambulatory Visit: Payer: Self-pay | Admitting: Internal Medicine

## 2021-04-15 DIAGNOSIS — Z1231 Encounter for screening mammogram for malignant neoplasm of breast: Secondary | ICD-10-CM

## 2021-05-17 ENCOUNTER — Other Ambulatory Visit: Payer: Self-pay

## 2021-05-17 ENCOUNTER — Ambulatory Visit
Admission: RE | Admit: 2021-05-17 | Discharge: 2021-05-17 | Disposition: A | Discharge status: Home / Self Care | Payer: Managed Care, Other (non HMO) | Source: Ambulatory Visit | Attending: Internal Medicine | Admitting: Internal Medicine

## 2021-05-17 DIAGNOSIS — Z1231 Encounter for screening mammogram for malignant neoplasm of breast: Secondary | ICD-10-CM | POA: Insufficient documentation

## 2021-05-19 ENCOUNTER — Other Ambulatory Visit: Payer: Self-pay | Admitting: Internal Medicine

## 2021-05-19 DIAGNOSIS — R928 Other abnormal and inconclusive findings on diagnostic imaging of breast: Secondary | ICD-10-CM

## 2021-05-31 ENCOUNTER — Ambulatory Visit
Admission: RE | Admit: 2021-05-31 | Discharge: 2021-05-31 | Disposition: A | Discharge status: Home / Self Care | Payer: Managed Care, Other (non HMO) | Source: Ambulatory Visit | Attending: Internal Medicine | Admitting: Internal Medicine

## 2021-05-31 ENCOUNTER — Other Ambulatory Visit: Payer: Self-pay

## 2021-05-31 DIAGNOSIS — R928 Other abnormal and inconclusive findings on diagnostic imaging of breast: Secondary | ICD-10-CM | POA: Diagnosis present

## 2021-08-10 ENCOUNTER — Ambulatory Visit: Payer: Self-pay | Admitting: General Surgery

## 2021-08-10 NOTE — H&P (Signed)
PATIENT PROFILE: Kendra Bates is a 51 y.o. female who presents to the Clinic for evaluation of persistent right breast mass.  PCP:  Idelle Crouch, MD  HISTORY OF PRESENT ILLNESS: Kendra Bates reports patient with persistent right breast mass.  She understand that he has been growing in size.  The mass is painful.  Tender to palpation.  Pain aggravated by applying pressure.  There has been no alleviating factors.  Pain does not radiate to the part of the body.  Patient denies any drainage.  Patient denies any nipple discharge.  Patient denies any history of breast cancer.  Patient had a mammogram on March 2023 for evaluation of the right breast mass.  There was no concerning malignancy.  I personally evaluated the images.   PROBLEM LIST: Problem List  Date Reviewed: 06/09/2021          Noted   Pure hypercholesterolemia 06/09/2021   Cervicalgia 02/18/2020   Cervical radiculitis 02/18/2020   DDD (degenerative disc disease), cervical 10/07/2015   Benign hypertension Unknown   History of uterine fibroid Unknown   Vitamin D deficiency Unknown    GENERAL REVIEW OF SYSTEMS:   General ROS: negative for - chills, fatigue, fever, weight gain or weight loss Allergy and Immunology ROS: negative for - hives  Hematological and Lymphatic ROS: negative for - bleeding problems or bruising, negative for palpable nodes Endocrine ROS: negative for - heat or cold intolerance, hair changes Respiratory ROS: negative for - cough, shortness of breath or wheezing Cardiovascular ROS: no chest pain or palpitations GI ROS: negative for nausea, vomiting, abdominal pain, diarrhea, constipation Musculoskeletal ROS: negative for - joint swelling or muscle pain Neurological ROS: negative for - confusion, syncope Dermatological ROS: negative for pruritus and rash Psychiatric: negative for anxiety, depression, difficulty sleeping and memory loss  MEDICATIONS: Current Outpatient Medications  Medication Sig Dispense  Refill   amLODIPine (NORVASC) 5 MG tablet Take 1 tablet (5 mg total) by mouth once daily 90 tablet 3   escitalopram oxalate (LEXAPRO) 20 MG tablet Take 1 tablet (20 mg total) by mouth once daily for 90 days 90 tablet 2   losartan (COZAAR) 100 MG tablet Take 1 tablet (100 mg total) by mouth once daily 90 tablet 3   No current facility-administered medications for this visit.    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   Drug use: No   Sexual activity: Yes    PHYSICAL EXAM: Vitals:   08/10/21 0838  BP: 119/80  Pulse: 87   Body mass index is 26.29 kg/m. Weight: 71.7 kg (158 lb)   GENERAL: Alert, active, oriented x3  HEENT: Pupils equal reactive to  light. Extraocular movements are intact. Sclera clear. Palpebral conjunctiva normal red color.Pharynx clear.  NECK: Supple with no palpable mass and no adenopathy.  LUNGS: Sound clear with no rales rhonchi or wheezes.  HEART: Regular rhythm S1 and S2 without murmur.  BREAST: Right breast palpable mass in the lower outer quadrant of the areola.  Tender to palpation.  No fluid collection.  Mass seems to be solid, normal wall.  No nipple discharge.  No nipple retraction.  ABDOMEN: Soft and  depressible, nontender with no palpable mass, no hepatomegaly. Wounds dry and clean.  EXTREMITIES: Well-developed well-nourished symmetrical with no dependent edema.  NEUROLOGICAL: Awake alert oriented, facial expression symmetrical, moving all extremities.  REVIEW OF DATA: I have reviewed the following data today: No visits with results within 3 Month(s) from this visit.  Latest known visit with results is:  Office Visit on 01/12/2021  Component Date Value   Influenza A PCR 01/12/2021 Positive (!)    Influenza B PCR 01/12/2021 Negative    RSV PCR 01/12/2021 Negative    SARS-CoV2 PCR 01/12/2021 Negative      ASSESSMENT: [name] is a 51 y.o. female presenting for consultation for right breast palpable mass.  Patient with right breast palpable mass, persistent.  This is tender.  Growing in size.  Mammogram negative for concerning malignancy.  Due to the fact that this is growing and tender to palpation excisional biopsy is recommended.  Patient oriented about the procedure, the risks and the benefits.  She understood the risks and agreed to proceed with the surgery.  Subareolar mass of right breast [N63.41]  PLAN: 1.  Right breast excisional biopsy of palpable right breast mass (19120) 2.  Avoid taking aspirin 5 days before the surgery 3.  Contact us if you have any concern  Patient verbalized understanding, all questions were answered, and were agreeable with the plan outlined above.   Herbert Pun, MD  Electronically signed by Herbert Pun, MD

## 2021-08-10 NOTE — H&P (View-Only) (Signed)
PATIENT PROFILE: Kendra Bates is a 51 y.o. female who presents to the Clinic for evaluation of persistent right breast mass.  PCP:  Idelle Crouch, MD  HISTORY OF PRESENT ILLNESS: Ms. Schey reports patient with persistent right breast mass.  She understand that he has been growing in size.  The mass is painful.  Tender to palpation.  Pain aggravated by applying pressure.  There has been no alleviating factors.  Pain does not radiate to the part of the body.  Patient denies any drainage.  Patient denies any nipple discharge.  Patient denies any history of breast cancer.  Patient had a mammogram on March 2023 for evaluation of the right breast mass.  There was no concerning malignancy.  I personally evaluated the images.   PROBLEM LIST: Problem List  Date Reviewed: 06/09/2021          Noted   Pure hypercholesterolemia 06/09/2021   Cervicalgia 02/18/2020   Cervical radiculitis 02/18/2020   DDD (degenerative disc disease), cervical 10/07/2015   Benign hypertension Unknown   History of uterine fibroid Unknown   Vitamin D deficiency Unknown    GENERAL REVIEW OF SYSTEMS:   General ROS: negative for - chills, fatigue, fever, weight gain or weight loss Allergy and Immunology ROS: negative for - hives  Hematological and Lymphatic ROS: negative for - bleeding problems or bruising, negative for palpable nodes Endocrine ROS: negative for - heat or cold intolerance, hair changes Respiratory ROS: negative for - cough, shortness of breath or wheezing Cardiovascular ROS: no chest pain or palpitations GI ROS: negative for nausea, vomiting, abdominal pain, diarrhea, constipation Musculoskeletal ROS: negative for - joint swelling or muscle pain Neurological ROS: negative for - confusion, syncope Dermatological ROS: negative for pruritus and rash Psychiatric: negative for anxiety, depression, difficulty sleeping and memory loss  MEDICATIONS: Current Outpatient Medications  Medication Sig Dispense  Refill   amLODIPine (NORVASC) 5 MG tablet Take 1 tablet (5 mg total) by mouth once daily 90 tablet 3   escitalopram oxalate (LEXAPRO) 20 MG tablet Take 1 tablet (20 mg total) by mouth once daily for 90 days 90 tablet 2   losartan (COZAAR) 100 MG tablet Take 1 tablet (100 mg total) by mouth once daily 90 tablet 3   No current facility-administered medications for this visit.    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   Drug use: No   Sexual activity: Yes    PHYSICAL EXAM: Vitals:   08/10/21 0838  BP: 119/80  Pulse: 87   Body mass index is 26.29 kg/m. Weight: 71.7 kg (158 lb)   GENERAL: Alert, active, oriented x3  HEENT: Pupils equal reactive to  light. Extraocular movements are intact. Sclera clear. Palpebral conjunctiva normal red color.Pharynx clear.  NECK: Supple with no palpable mass and no adenopathy.  LUNGS: Sound clear with no rales rhonchi or wheezes.  HEART: Regular rhythm S1 and S2 without murmur.  BREAST: Right breast palpable mass in the lower outer quadrant of the areola.  Tender to palpation.  No fluid collection.  Mass seems to be solid, normal wall.  No nipple discharge.  No nipple retraction.  ABDOMEN: Soft and  depressible, nontender with no palpable mass, no hepatomegaly. Wounds dry and clean.  EXTREMITIES: Well-developed well-nourished symmetrical with no dependent edema.  NEUROLOGICAL: Awake alert oriented, facial expression symmetrical, moving all extremities.  REVIEW OF DATA: I have reviewed the following data today: No visits with results within 3 Month(s) from this visit.  Latest known visit with results is:  Office Visit on 01/12/2021  Component Date Value   Influenza A PCR 01/12/2021 Positive (!)    Influenza B PCR 01/12/2021 Negative    RSV PCR 01/12/2021 Negative    SARS-CoV2 PCR 01/12/2021 Negative      ASSESSMENT: Ms. Stanard is a 51 y.o. female presenting for consultation for right breast palpable mass.  Patient with right breast palpable mass, persistent.  This is tender.  Growing in size.  Mammogram negative for concerning malignancy.  Due to the fact that this is growing and tender to palpation excisional biopsy is recommended.  Patient oriented about the procedure, the risks and the benefits.  She understood the risks and agreed to proceed with the surgery.  Subareolar mass of right breast [N63.41]  PLAN: 1.  Right breast excisional biopsy of palpable right breast mass (19120) 2.  Avoid taking aspirin 5 days before the surgery 3.  Contact us if you have any concern  Patient verbalized understanding, all questions were answered, and were agreeable with the plan outlined above.   Herbert Pun, MD  Electronically signed by Herbert Pun, MD

## 2021-08-16 ENCOUNTER — Encounter
Admission: RE | Admit: 2021-08-16 | Discharge: 2021-08-16 | Disposition: A | Discharge status: Home / Self Care | Payer: Managed Care, Other (non HMO) | Source: Ambulatory Visit | Attending: General Surgery | Admitting: General Surgery

## 2021-08-16 ENCOUNTER — Other Ambulatory Visit: Payer: Self-pay

## 2021-08-16 VITALS — Ht 65.0 in | Wt 158.0 lb

## 2021-08-16 DIAGNOSIS — Z01818 Encounter for other preprocedural examination: Secondary | ICD-10-CM

## 2021-08-16 DIAGNOSIS — I10 Essential (primary) hypertension: Secondary | ICD-10-CM

## 2021-08-16 HISTORY — DX: Other specified postprocedural states: Z98.890

## 2021-08-16 NOTE — Patient Instructions (Addendum)
Your procedure is scheduled on: Friday 08/19/21 Report to the Registration Desk on the 1st floor of the [facility]. To find out your arrival time, please call [phone] between 1PM - 3PM on: Thursday 08/18/21 If your arrival time is 6:00 am, do not arrive prior to that time as the [facility] entrance doors do not open until 6:00 am.  REMEMBER: Instructions that are not followed completely may result in serious medical risk, up to and including death; or upon the discretion of your surgeon and anesthesiologist your surgery may need to be rescheduled.  Do not eat or drink after midnight the night before surgery.  No gum chewing, lozengers or hard candies.  TAKE THESE MEDICATIONS THE MORNING OF SURGERY WITH A SIP OF WATER: amLODipine (NORVASC) 5 MG tablet escitalopram (LEXAPRO) 10 MG tablet  One week prior to surgery: Stop Anti-inflammatories (NSAIDS) such as Advil, Aleve, Ibuprofen, Motrin, Naproxen, Naprosyn and Aspirin based products such as Excedrin, Goodys Powder, BC Powder.   Stop taking your ANY OVER THE COUNTER supplements until after surgery.  You may however, continue to take Tylenol if needed for pain up until the day of surgery.  No Alcohol for 24 hours before or after surgery.  No Smoking including e-cigarettes for 24 hours prior to surgery.  No chewable tobacco products for at least 6 hours prior to surgery.  No nicotine patches on the day of surgery.  Do not use any "recreational" drugs for at least a week prior to your surgery.  Please be advised that the combination of cocaine and anesthesia may have negative outcomes, up to and including death. If you test positive for cocaine, your surgery will be cancelled.  On the morning of surgery brush your teeth with toothpaste and water, you may rinse your mouth with mouthwash if you wish. Do not swallow any toothpaste or mouthwash.  Use CHG Soap as directed on instruction sheet.  Do not wear jewelry, make-up,  hairpins, clips or nail polish.  Do not wear lotions, powders, or perfumes.   Do not shave body from the neck down 48 hours prior to surgery just in case you cut yourself which could leave a site for infection.  Also, freshly shaved skin may become irritated if using the CHG soap.  Contact lenses or eyeglasses may not be worn into surgery.  Do not bring valuables to the hospital. The [facility] is not responsible for any missing/lost belongings or valuables.   Notify your doctor if there is any change in your medical condition (cold, fever, infection).  Wear comfortable clothing (specific to your surgery type) to the hospital.  After surgery, you can help prevent lung complications by doing breathing exercises.  Take deep breaths and cough every 1-2 hours.   If you are being discharged the day of surgery, you will not be allowed to drive home. You will need a responsible adult (18 years or older) to drive you home and stay with you that night.   If you are taking public transportation, you will need to have a responsible adult (18 years or older) with you. Please confirm with your physician that it is acceptable to use public transportation.   Please call the [facility] Dept. at [phone] if you have any questions about these instructions.  Surgery Visitation Policy:  Patients undergoing a surgery or procedure may have two family members or support persons with them as long as the person is not COVID-19 positive or experiencing its symptoms.  Inpatient Visitation:    Visiting hours are 7 a.m. to 8 p.m. Up to four visitors are allowed at one time in a patient room, including children. The visitors may rotate out with other people during the day. One designated support person (adult) may remain overnight.

## 2021-08-17 ENCOUNTER — Encounter
Admission: RE | Admit: 2021-08-17 | Discharge: 2021-08-17 | Disposition: A | Discharge status: Home / Self Care | Payer: Managed Care, Other (non HMO) | Source: Ambulatory Visit | Attending: General Surgery | Admitting: General Surgery

## 2021-08-17 DIAGNOSIS — I10 Essential (primary) hypertension: Secondary | ICD-10-CM | POA: Insufficient documentation

## 2021-08-17 DIAGNOSIS — Z01818 Encounter for other preprocedural examination: Secondary | ICD-10-CM | POA: Insufficient documentation

## 2021-08-17 LAB — CBC
HCT: 39.9 % (ref 36.0–46.0)
Hemoglobin: 13.1 g/dL (ref 12.0–15.0)
MCH: 30.7 pg (ref 26.0–34.0)
MCHC: 32.8 g/dL (ref 30.0–36.0)
MCV: 93.4 fL (ref 80.0–100.0)
Platelets: 195 10*3/uL (ref 150–400)
RBC: 4.27 MIL/uL (ref 3.87–5.11)
RDW: 11.9 % (ref 11.5–15.5)
WBC: 4.6 10*3/uL (ref 4.0–10.5)
nRBC: 0 % (ref 0.0–0.2)

## 2021-08-17 LAB — BASIC METABOLIC PANEL
Anion gap: 5 (ref 5–15)
BUN: 19 mg/dL (ref 6–20)
CO2: 28 mmol/L (ref 22–32)
Calcium: 9.1 mg/dL (ref 8.9–10.3)
Chloride: 105 mmol/L (ref 98–111)
Creatinine, Ser: 0.6 mg/dL (ref 0.44–1.00)
GFR, Estimated: >60 mL/min (ref >60)
Glucose, Bld: 78 mg/dL (ref 70–99)
Potassium: 4 mmol/L (ref 3.5–5.1)
Sodium: 138 mmol/L (ref 135–145)

## 2021-08-19 ENCOUNTER — Ambulatory Visit
Admission: RE | Admit: 2021-08-19 | Discharge: 2021-08-19 | Disposition: A | Discharge status: Home / Self Care | Payer: Managed Care, Other (non HMO) | Attending: General Surgery | Admitting: General Surgery

## 2021-08-19 ENCOUNTER — Ambulatory Visit: Payer: Managed Care, Other (non HMO) | Admitting: Anesthesiology

## 2021-08-19 ENCOUNTER — Encounter
Admission: RE | Disposition: A | Discharge status: Home / Self Care | Payer: Self-pay | Source: Home / Self Care | Attending: General Surgery

## 2021-08-19 ENCOUNTER — Other Ambulatory Visit: Payer: Self-pay

## 2021-08-19 ENCOUNTER — Encounter: Payer: Self-pay | Admitting: General Surgery

## 2021-08-19 DIAGNOSIS — N611 Abscess of the breast and nipple: Secondary | ICD-10-CM | POA: Insufficient documentation

## 2021-08-19 DIAGNOSIS — Z79899 Other long term (current) drug therapy: Secondary | ICD-10-CM | POA: Diagnosis not present

## 2021-08-19 DIAGNOSIS — N6310 Unspecified lump in the right breast, unspecified quadrant: Secondary | ICD-10-CM | POA: Diagnosis present

## 2021-08-19 DIAGNOSIS — N6001 Solitary cyst of right breast: Secondary | ICD-10-CM | POA: Diagnosis not present

## 2021-08-19 DIAGNOSIS — I10 Essential (primary) hypertension: Secondary | ICD-10-CM | POA: Insufficient documentation

## 2021-08-19 HISTORY — PX: EXCISION OF BREAST BIOPSY: SHX5822

## 2021-08-19 SURGERY — EXCISION OF BREAST BIOPSY
Anesthesia: General | Site: Breast | Laterality: Right

## 2021-08-19 MED ORDER — TRAMADOL HCL 50 MG PO TABS: 50.0000 mg | ORAL_TABLET | Freq: Four times a day (QID) | ORAL | 0 refills | Status: AC | PRN

## 2021-08-19 MED ORDER — STERILE WATER FOR IRRIGATION IR SOLN
Status: DC | PRN
  Administered 2021-08-19: 500 mL

## 2021-08-19 MED ORDER — FAMOTIDINE 20 MG PO TABS: 20.0000 mg | ORAL_TABLET | Freq: Once | ORAL | Status: AC

## 2021-08-19 MED ORDER — DEXAMETHASONE SODIUM PHOSPHATE 10 MG/ML IJ SOLN
INTRAMUSCULAR | Status: AC
  Filled 2021-08-19: qty 1

## 2021-08-19 MED ORDER — CEFAZOLIN SODIUM-DEXTROSE 2-4 GM/100ML-% IV SOLN
2.0000 g | INTRAVENOUS | Status: AC
  Administered 2021-08-19: 2 g via INTRAVENOUS

## 2021-08-19 MED ORDER — BUPIVACAINE-EPINEPHRINE (PF) 0.5% -1:200000 IJ SOLN
INTRAMUSCULAR | Status: DC | PRN
  Administered 2021-08-19 (×2): 15 mL

## 2021-08-19 MED ORDER — ORAL CARE MOUTH RINSE: 15.0000 mL | Freq: Once | OROMUCOSAL | Status: AC

## 2021-08-19 MED ORDER — DEXMEDETOMIDINE HCL IN NACL 80 MCG/20ML IV SOLN
INTRAVENOUS | Status: AC
  Filled 2021-08-19: qty 20

## 2021-08-19 MED ORDER — CEFAZOLIN SODIUM-DEXTROSE 2-4 GM/100ML-% IV SOLN
INTRAVENOUS | Status: AC
  Filled 2021-08-19: qty 100

## 2021-08-19 MED ORDER — DEXAMETHASONE SODIUM PHOSPHATE 10 MG/ML IJ SOLN
INTRAMUSCULAR | Status: DC | PRN
  Administered 2021-08-19: 7.5 mg via INTRAVENOUS

## 2021-08-19 MED ORDER — CHLORHEXIDINE GLUCONATE 0.12 % MT SOLN: 15.0000 mL | Freq: Once | OROMUCOSAL | Status: AC

## 2021-08-19 MED ORDER — FAMOTIDINE 20 MG PO TABS
ORAL_TABLET | ORAL | Status: AC
  Administered 2021-08-19: 20 mg via ORAL
  Filled 2021-08-19: qty 1

## 2021-08-19 MED ORDER — PROPOFOL 500 MG/50ML IV EMUL
INTRAVENOUS | Status: DC | PRN
  Administered 2021-08-19: 125 ug/kg/min via INTRAVENOUS

## 2021-08-19 MED ORDER — PROPOFOL 1000 MG/100ML IV EMUL
INTRAVENOUS | Status: AC
Start: 2021-08-19 — End: ?
  Filled 2021-08-19: qty 100

## 2021-08-19 MED ORDER — ONDANSETRON HCL 4 MG/2ML IJ SOLN
INTRAMUSCULAR | Status: DC | PRN
  Administered 2021-08-19: 4 mg via INTRAVENOUS

## 2021-08-19 MED ORDER — MIDAZOLAM HCL 2 MG/2ML IJ SOLN
INTRAMUSCULAR | Status: DC | PRN
  Administered 2021-08-19: 2 mg via INTRAVENOUS

## 2021-08-19 MED ORDER — DOXYCYCLINE HYCLATE 50 MG PO CAPS: 50.0000 mg | ORAL_CAPSULE | Freq: Two times a day (BID) | ORAL | 0 refills | Status: AC

## 2021-08-19 MED ORDER — MIDAZOLAM HCL 2 MG/2ML IJ SOLN
INTRAMUSCULAR | Status: AC
  Filled 2021-08-19: qty 2

## 2021-08-19 MED ORDER — FENTANYL CITRATE (PF) 100 MCG/2ML IJ SOLN
INTRAMUSCULAR | Status: DC | PRN
  Administered 2021-08-19 (×2): 25 ug via INTRAVENOUS
  Administered 2021-08-19: 50 ug via INTRAVENOUS

## 2021-08-19 MED ORDER — PROPOFOL 10 MG/ML IV BOLUS
INTRAVENOUS | Status: DC | PRN
  Administered 2021-08-19: 30 mg via INTRAVENOUS

## 2021-08-19 MED ORDER — LIDOCAINE HCL (CARDIAC) PF 100 MG/5ML IV SOSY
PREFILLED_SYRINGE | INTRAVENOUS | Status: DC | PRN
  Administered 2021-08-19: 60 mg via INTRAVENOUS

## 2021-08-19 MED ORDER — DEXMEDETOMIDINE (PRECEDEX) IN NS 20 MCG/5ML (4 MCG/ML) IV SYRINGE
PREFILLED_SYRINGE | INTRAVENOUS | Status: DC | PRN
  Administered 2021-08-19: 8 ug via INTRAVENOUS

## 2021-08-19 MED ORDER — LACTATED RINGERS IV SOLN
INTRAVENOUS | Status: DC
Start: 2021-08-19 — End: 2021-08-19

## 2021-08-19 MED ORDER — PROPOFOL 10 MG/ML IV BOLUS
INTRAVENOUS | Status: AC
  Filled 2021-08-19: qty 20

## 2021-08-19 MED ORDER — FENTANYL CITRATE (PF) 100 MCG/2ML IJ SOLN
INTRAMUSCULAR | Status: AC
  Filled 2021-08-19: qty 2

## 2021-08-19 MED ORDER — CHLORHEXIDINE GLUCONATE 0.12 % MT SOLN
OROMUCOSAL | Status: AC
  Administered 2021-08-19: 15 mL via OROMUCOSAL
  Filled 2021-08-19: qty 15

## 2021-08-19 MED ORDER — LIDOCAINE HCL (PF) 2 % IJ SOLN
INTRAMUSCULAR | Status: AC
  Filled 2021-08-19: qty 5

## 2021-08-19 MED ORDER — ONDANSETRON HCL 4 MG/2ML IJ SOLN
INTRAMUSCULAR | Status: AC
Start: 2021-08-19 — End: ?
  Filled 2021-08-19: qty 2

## 2021-08-19 SURGICAL SUPPLY — 53 items
ADH SKN CLS APL DERMABOND .7 (GAUZE/BANDAGES/DRESSINGS) ×1
APL PRP STRL LF DISP 70% ISPRP (MISCELLANEOUS) ×1
BLADE SURG 15 STRL LF DISP TIS (BLADE) ×1 IMPLANT
BLADE SURG 15 STRL SS (BLADE) ×2
CHLORAPREP W/TINT 26 (MISCELLANEOUS) ×2 IMPLANT
CNTNR SPEC 2.5X3XGRAD LEK (MISCELLANEOUS) ×1
CONT SPEC 4OZ STER OR WHT (MISCELLANEOUS) ×1
CONT SPEC 4OZ STRL OR WHT (MISCELLANEOUS) ×1
CONTAINER SPEC 2.5X3XGRAD LEK (MISCELLANEOUS) ×1 IMPLANT
DERMABOND ADVANCED (GAUZE/BANDAGES/DRESSINGS) ×1
DERMABOND ADVANCED .7 DNX12 (GAUZE/BANDAGES/DRESSINGS) ×1 IMPLANT
DEVICE DUBIN SPECIMEN MAMMOGRA (MISCELLANEOUS) ×1 IMPLANT
DRAPE LAPAROTOMY TRNSV 106X77 (MISCELLANEOUS) ×2 IMPLANT
DRSG TEGADERM 4X4.75 (GAUZE/BANDAGES/DRESSINGS) ×1 IMPLANT
ELECT CAUTERY BLADE TIP 2.5 (TIP) ×2
ELECT REM PT RETURN 9FT ADLT (ELECTROSURGICAL) ×2
ELECTRODE CAUTERY BLDE TIP 2.5 (TIP) ×1 IMPLANT
ELECTRODE REM PT RTRN 9FT ADLT (ELECTROSURGICAL) ×1 IMPLANT
GAUZE 4X4 16PLY ~~LOC~~+RFID DBL (SPONGE) ×2 IMPLANT
GAUZE PACKING 1/4 X5 YD (GAUZE/BANDAGES/DRESSINGS) ×1 IMPLANT
GAUZE SPONGE 4X4 12PLY STRL (GAUZE/BANDAGES/DRESSINGS) ×1 IMPLANT
GLOVE BIO SURGEON STRL SZ 6.5 (GLOVE) ×2 IMPLANT
GLOVE BIOGEL PI IND STRL 6.5 (GLOVE) ×1 IMPLANT
GLOVE BIOGEL PI INDICATOR 6.5 (GLOVE) ×1
GOWN STRL REUS W/ TWL LRG LVL3 (GOWN DISPOSABLE) ×3 IMPLANT
GOWN STRL REUS W/TWL LRG LVL3 (GOWN DISPOSABLE) ×6
KIT MARKER MARGIN INK (KITS) IMPLANT
KIT TURNOVER KIT A (KITS) ×2 IMPLANT
LABEL OR SOLS (LABEL) ×2 IMPLANT
MANIFOLD NEPTUNE II (INSTRUMENTS) ×2 IMPLANT
MARGIN MAP 10MM (MISCELLANEOUS) ×2 IMPLANT
MARKER MARGIN CORRECT CLIP (MARKER) IMPLANT
NDL HYPO 25X1 1.5 SAFETY (NEEDLE) ×1 IMPLANT
NEEDLE HYPO 25X1 1.5 SAFETY (NEEDLE) ×2 IMPLANT
PACK BASIN MINOR ARMC (MISCELLANEOUS) ×2 IMPLANT
RETRACTOR RING XSMALL (MISCELLANEOUS) IMPLANT
RTRCTR WOUND ALEXIS 13CM XS SH (MISCELLANEOUS)
SUT ETHILON 3-0 FS-10 30 BLK (SUTURE)
SUT ETHILON 4-0 (SUTURE) ×2
SUT ETHILON 4-0 FS2 18XMFL BLK (SUTURE) ×1
SUT MNCRL 4-0 (SUTURE) ×2
SUT MNCRL 4-0 27XMFL (SUTURE) ×1
SUT SILK 2 0 SH (SUTURE) ×1 IMPLANT
SUT VIC AB 3-0 SH 27 (SUTURE) ×2
SUT VIC AB 3-0 SH 27X BRD (SUTURE) ×1 IMPLANT
SUTURE EHLN 3-0 FS-10 30 BLK (SUTURE) ×1 IMPLANT
SUTURE ETHLN 4-0 FS2 18XMF BLK (SUTURE) IMPLANT
SUTURE MNCRL 4-0 27XMF (SUTURE) ×1 IMPLANT
SWAB CULTURE AMIES ANAERIB BLU (MISCELLANEOUS) ×1 IMPLANT
SYR 10ML LL (SYRINGE) ×2 IMPLANT
SYR BULB IRRIG 60ML STRL (SYRINGE) ×1 IMPLANT
TRAP NEPTUNE SPECIMEN COLLECT (MISCELLANEOUS) ×1 IMPLANT
WATER STERILE IRR 500ML POUR (IV SOLUTION) ×2 IMPLANT

## 2021-08-19 NOTE — Anesthesia Preprocedure Evaluation (Signed)
Anesthesia Evaluation  Patient identified by MRN, date of birth, ID band Patient awake    Reviewed: Allergy & Precautions, H&P , NPO status , Patient's Chart, lab work & pertinent test results, reviewed documented beta blocker date and time   History of Anesthesia Complications (+) PONV and history of anesthetic complications  Airway Mallampati: II  TM Distance: >3 FB Neck ROM: full    Dental  (+) Teeth Intact   Pulmonary neg pulmonary ROS,    Pulmonary exam normal        Cardiovascular Exercise Tolerance: Good hypertension, On Medications negative cardio ROS Normal cardiovascular exam Rate:Normal     Neuro/Psych negative neurological ROS  negative psych ROS   GI/Hepatic negative GI ROS, Neg liver ROS,   Endo/Other  negative endocrine ROS  Renal/GU negative Renal ROS  negative genitourinary   Musculoskeletal   Abdominal   Peds  Hematology negative hematology ROS (+)   Anesthesia Other Findings   Reproductive/Obstetrics negative OB ROS                             Anesthesia Physical Anesthesia Plan  ASA: 2  Anesthesia Plan: General LMA   Post-op Pain Management:    Induction:   PONV Risk Score and Plan:   Airway Management Planned:   Additional Equipment:   Intra-op Plan:   Post-operative Plan:   Informed Consent: I have reviewed the patients History and Physical, chart, labs and discussed the procedure including the risks, benefits and alternatives for the proposed anesthesia with the patient or authorized representative who has indicated his/her understanding and acceptance.       Plan Discussed with: CRNA  Anesthesia Plan Comments:         Anesthesia Quick Evaluation

## 2021-08-19 NOTE — Interval H&P Note (Signed)
History and Physical Interval Note:  08/19/2021 9:45 AM  Kendra Bates  has presented today for surgery, with the diagnosis of N63.41 Subareolar mass of rt breast.  The various methods of treatment have been discussed with the patient and family. After consideration of risks, benefits and other options for treatment, the patient has consented to  Procedure(s): EXCISION OF BREAST BIOPSY (Right) as a surgical intervention.  The patient's history has been reviewed, patient examined, no change in status, stable for surgery.  I have reviewed the patient's chart and labs.  Questions were answered to the patient's satisfaction.     Herbert Pun

## 2021-08-19 NOTE — Discharge Instructions (Addendum)
AMBULATORY SURGERY  DISCHARGE INSTRUCTIONS   The drugs that you were given will stay in your system until tomorrow so for the next 24 hours you should not:  Drive an automobile Make any legal decisions Drink any alcoholic beverage   You may resume regular meals tomorrow.  Today it is better to start with liquids and gradually work up to solid foods.  You may eat anything you prefer, but it is better to start with liquids, then soup and crackers, and gradually work up to solid foods.   Please notify your doctor immediately if you have any unusual bleeding, trouble breathing, redness and pain at the surgery site, drainage, fever, or pain not relieved by medication.    Additional Instructions:   Go to the office Thursday for removal of your stitches.     Please contact your physician with any problems or Same Day Surgery at [phone], Monday through Friday 6 am to 4 pm, or [place] at [facility] number at [phone].

## 2021-08-19 NOTE — Transfer of Care (Signed)
Immediate Anesthesia Transfer of Care Note  Patient: Kendra Bates  Procedure(s) Performed: EXCISION OF BREAST BIOPSY (Right: Breast)  Patient Location: PACU  Anesthesia Type:General  Level of Consciousness: awake  Airway & Oxygen Therapy: Patient Spontanous Breathing  Post-op Assessment: Report given to RN  Post vital signs: stable  Last Vitals:  Vitals Value Taken Time  BP 100/51 08/19/21 1046  Temp    Pulse 82 08/19/21 1047  Resp 20 08/19/21 1047  SpO2 97 % 08/19/21 1047  Vitals shown include unvalidated device data.  Last Pain:  Vitals:   08/19/21 0811  PainSc: 0-No pain         Complications: No notable events documented.

## 2021-08-19 NOTE — Op Note (Signed)
Preoperative diagnosis: Right breast mass.  Postoperative diagnosis: Right breast mass.  Procedure: Right excisional biopsy.  Anesthesia: GETA  Surgeon: Dr. Windell Moment  Wound Classification: Clean  Indications:  Patient is a 51 y.o. female with a palpable right breast mass underwent workup with ultrasound, diagnostic mammogram. These were not diagnostic and continue with tender palpable mass. Excision was requested by the patient due to pain and fear of cancer.   Findings: 1. Palpable mass at 7 o clock position of the periareolar area.  Description of procedure: The patient was taken to the operating room and placed supine on the operating table, and deep sedation was administered, the right chest was prepped and draped in the usual sterile fashion. A time-out was completed verifying correct patient, procedure, site, positioning, and implant(s) and/or special equipment prior to beginning this procedure.  A circumareolar skin incision incision was planned adjacent to the palpable mass. Local anesthesia was infiltrated and a skin incision was made. Flaps were raised and the location of the mass confirmed by palpation.    The mass was dissected from surrounding tissues using scalpel. Use of electrocautery was avoided on the specimen. After removing the lump, the cavity was palpated and no additional abnormalities was palpated.  Hemostasis was achieved with electrocautery. Wound closed with 2 interrupted 4-0 Nylon.  A dressing was applied.  The patient tolerated the procedure well and was taken to the postanesthesia care unit in stable condition.   Specimen: Right breast mass  Complications: None  Estimated Blood Loss: 3 mL

## 2021-08-22 ENCOUNTER — Encounter: Payer: Self-pay | Admitting: General Surgery

## 2021-08-22 LAB — AEROBIC/ANAEROBIC CULTURE W GRAM STAIN (SURGICAL/DEEP WOUND)

## 2021-08-22 LAB — SURGICAL PATHOLOGY

## 2021-08-29 NOTE — Anesthesia Postprocedure Evaluation (Signed)
Anesthesia Post Note  Patient: Kendra Bates  Procedure(s) Performed: EXCISION OF BREAST BIOPSY (Right: Breast)  Patient location during evaluation: PACU Anesthesia Type: General Level of consciousness: awake and alert Pain management: pain level controlled Vital Signs Assessment: post-procedure vital signs reviewed and stable Respiratory status: spontaneous breathing, nonlabored ventilation, respiratory function stable and patient connected to nasal cannula oxygen Cardiovascular status: blood pressure returned to baseline and stable Postop Assessment: no apparent nausea or vomiting Anesthetic complications: no   No notable events documented.   Last Vitals:  Vitals:   08/19/21 1100 08/19/21 1115  BP: 110/62 (!) 111/57  Pulse: 71 73  Resp: 16 16  Temp:  (!) 36.3 C  SpO2: 97% 97%    Last Pain:  Vitals:   08/19/21 1115  PainSc: 0-No pain                 Molli Barrows

## 2021-12-15 ENCOUNTER — Other Ambulatory Visit: Payer: Self-pay | Admitting: Internal Medicine

## 2021-12-15 DIAGNOSIS — Z1231 Encounter for screening mammogram for malignant neoplasm of breast: Secondary | ICD-10-CM

## 2022-07-13 ENCOUNTER — Ambulatory Visit
Admission: RE | Admit: 2022-07-13 | Discharge: 2022-07-13 | Disposition: A | Discharge status: Home / Self Care | Payer: Managed Care, Other (non HMO) | Source: Ambulatory Visit | Attending: Internal Medicine | Admitting: Internal Medicine

## 2022-07-13 DIAGNOSIS — Z1231 Encounter for screening mammogram for malignant neoplasm of breast: Secondary | ICD-10-CM

## 2023-01-09 ENCOUNTER — Other Ambulatory Visit: Payer: Self-pay | Admitting: Physical Medicine & Rehabilitation

## 2023-01-09 DIAGNOSIS — M542 Cervicalgia: Secondary | ICD-10-CM

## 2023-05-24 ENCOUNTER — Other Ambulatory Visit: Payer: Self-pay

## 2023-05-24 DIAGNOSIS — Z1231 Encounter for screening mammogram for malignant neoplasm of breast: Secondary | ICD-10-CM

## 2023-07-17 ENCOUNTER — Encounter

## 2023-07-24 ENCOUNTER — Ambulatory Visit
Admission: RE | Admit: 2023-07-24 | Discharge: 2023-07-24 | Disposition: A | Discharge status: Home / Self Care | Source: Ambulatory Visit

## 2023-07-24 DIAGNOSIS — Z1231 Encounter for screening mammogram for malignant neoplasm of breast: Secondary | ICD-10-CM | POA: Insufficient documentation
